# Patient Record
Sex: Male | Born: 1985 | Race: White | Hispanic: No | Marital: Single | Smoking: Never smoker
Health system: Southern US, Community
[De-identification: ages and names within clinical notes are randomized; demographics above are authoritative.]

## PROBLEM LIST (undated history)

## (undated) DIAGNOSIS — J302 Other seasonal allergic rhinitis: Secondary | ICD-10-CM

## (undated) DIAGNOSIS — K219 Gastro-esophageal reflux disease without esophagitis: Secondary | ICD-10-CM

---

## 2013-04-30 ENCOUNTER — Encounter (HOSPITAL_COMMUNITY): Payer: Self-pay

## 2013-04-30 ENCOUNTER — Emergency Department (HOSPITAL_COMMUNITY): Payer: BC Managed Care – PPO

## 2013-04-30 ENCOUNTER — Emergency Department (HOSPITAL_COMMUNITY)
Admission: EM | Admit: 2013-04-30 | Discharge: 2013-04-30 | Disposition: A | Discharge status: Home / Self Care | Payer: BC Managed Care – PPO | Attending: Emergency Medicine | Admitting: Emergency Medicine

## 2013-04-30 DIAGNOSIS — R209 Unspecified disturbances of skin sensation: Secondary | ICD-10-CM | POA: Insufficient documentation

## 2013-04-30 DIAGNOSIS — R202 Paresthesia of skin: Secondary | ICD-10-CM

## 2013-04-30 HISTORY — DX: Other seasonal allergic rhinitis: J30.2

## 2013-04-30 LAB — POCT I-STAT, CHEM 8
BUN: 11 mg/dL (ref 6–23)
Calcium, Ion: 1.19 mmol/L (ref 1.12–1.23)
Chloride: 105 mEq/L (ref 96–112)
Creatinine, Ser: 0.8 mg/dL (ref 0.50–1.35)
Glucose, Bld: 80 mg/dL (ref 70–99)

## 2013-04-30 NOTE — ED Provider Notes (Signed)
History     CSN: [id number]  Arrival date & time 04/30/13  1338   First MD Initiated Contact with Patient 04/30/13 1457      Chief Complaint  Patient presents with  . Numbness    (Consider location/radiation/quality/duration/timing/severity/associated sxs/prior treatment) HPI Complaint of numbness in right forearm and right thigh and right face gradual onset yesterday numbness has since spread to left for head. He has no trouble speaking no trouble seeing no trouble moving extremities or with coordination no weakness. No other complaint no pain anywhere. No other associated symptoms. No treatment prior to coming here. Denies "dizziness. Past Medical History  Diagnosis Date  . Seasonal allergies    Past medical history negative No past surgical history on file. Surgical history negative History reviewed. No pertinent family history.  History  Substance Use Topics  . Smoking status: Never Smoker   . Smokeless tobacco: Not on file  . Alcohol Use: No   Occasional alcohol no druguse   Review of Systems  Constitutional: Negative.   HENT: Negative.   Respiratory: Negative.   Cardiovascular: Negative.   Gastrointestinal: Negative.   Musculoskeletal: Negative.   Skin: Negative.   Neurological: Positive for numbness.  Psychiatric/Behavioral: Negative.   All other systems reviewed and are negative.    Allergies  Review of patient's allergies indicates no known allergies.  Home Medications   Current Outpatient Rx  Name  Route  Sig  Dispense  Refill  . guaiFENesin (ROBITUSSIN) 100 MG/5ML liquid   Oral   Take 200 mg by mouth 3 (three) times daily as needed for cough or congestion.         . Multiple Vitamin (MULTIVITAMIN WITH MINERALS) TABS   Oral   Take 1 tablet by mouth daily.         . Pseudoeph-Doxylamine-DM-APAP (NYQUIL PO)   Oral   Take 2 capsules by mouth every 4 (four) hours as needed (for cough).           BP 122/68  Pulse 96  Temp(Src) 97.7 F  (36.5 C) (Oral)  Resp 20  Ht 5\' 6"  (1.676 m)  Wt 244 lb (110.678 kg)  BMI 39.4 kg/m2  SpO2 99%  Physical Exam  Nursing note and vitals reviewed. Constitutional: He is oriented to person, place, and time. He appears well-developed and well-nourished.  HENT:  Head: Normocephalic and atraumatic.  Eyes: Conjunctivae are normal. Pupils are equal, round, and reactive to light.  Neck: Neck supple. No tracheal deviation present. No thyromegaly present.  Cardiovascular: Normal rate and regular rhythm.   No murmur heard. Pulmonary/Chest: Effort normal and breath sounds normal.  Abdominal: Soft. Bowel sounds are normal. He exhibits no distension. There is no tenderness.  Musculoskeletal: Normal range of motion. He exhibits no edema and no tenderness.  Neurological: He is alert and oriented to person, place, and time. He has normal reflexes. No cranial nerve deficit. Coordination normal.  Gait normal Romberg normal pronator drift normal  Skin: Skin is warm and dry. No rash noted.  Psychiatric: He has a normal mood and affect.    ED Course  Procedures (including critical care time)  Labs Reviewed - No data to display No results found.   No diagnosis found.  Results for orders placed during the hospital encounter of 04/30/13  POCT I-STAT, CHEM 8      Result Value Range   Sodium 141  135 - 145 mEq/L   Potassium 4.1  3.5 - 5.1 mEq/L   Chloride 105  96 - 112 mEq/L   BUN 11  6 - 23 mg/dL   Creatinine, Ser 1.61  0.50 - 1.35 mg/dL   Glucose, Bld 80  70 - 99 mg/dL   Calcium, Ion 0.96  0.45 - 1.23 mmol/L   TCO2 28  0 - 100 mmol/L   Hemoglobin 17.0  13.0 - 17.0 g/dL   HCT 40.9  81.1 - 91.4 %   Mr Brain Wo Contrast  04/30/2013   *RADIOLOGY REPORT*  Clinical Data: Right-sided numbness, which is improving.  MRI HEAD WITHOUT CONTRAST  Technique:  Multiplanar, multiecho pulse sequences of the brain and surrounding structures were obtained according to standard protocol without intravenous  contrast.  Comparison: None.  Findings: There is no evidence for acute infarction, intracranial hemorrhage, mass lesion, hydrocephalus, or extra-axial fluid. There is no cerebral or cerebellar atrophy.  Abnormal T2 and FLAIR subcentimeter hyperintensities in the right globus pallidus and the genu of the right internal capsule are not seen on the left. There are no similar lesions in and around the corpus callosum or periventricular white matter, although a similar right posterior frontal subcortical white matter lesion is also present.  No brainstem or cerebellar white matter lesions are seen. These lesions do not display restricted diffusion and do not appear acute but are abnormal findings at this age.  Considerations include vasculitis, demyelinating disease (MS), chronic infection, or idiopathic.  Flow voids are maintained in the major intracranial vessels.  There are no foci of chronic hemorrhage.  The pituitary and cerebellar tonsils are unremarkable.  No upper cervical region abnormalities are detected.  Normal corpus callosum.  Mild chronic sinus disease without air-fluid level.  Negative orbits.  No mastoid fluid.  A subcentimeter lateral retropharyngeal node is noted.  No other areas concerning for adenopathy are visualized.  IMPRESSION: No acute stroke, hemorrhage, or mass lesion.  Abnormal T2 hyperintensities in the right globus pallidus and internal capsule are not normally seen in this age.  Considerations include vasculitis, demyelinating lesion, chronic infection, or idiopathic.  No intracranial abnormality is detected which might result in right- sided numbness.   Original Report Authenticated By: Davonna Belling, M.D.     MDM  Patient and MRI scan discussed with Dr.Kirkpatrick, neurologist. He reviewed the MRI scan images. Plan no further treatment at present he feels that the patient should be followed with [facility] as an outpatient Return if symptoms worsen Diagnosis  paresthesias        Doug Sou, MD 04/30/13 1728

## 2013-04-30 NOTE — ED Notes (Signed)
He states he felt some right-sided "numbness", including right arm and leg, also right face area yesterday at ~11:30 p.m.  He c/o this "nimbness" persisting, and he is concerned because today he is a bit dizzy and also he tells me the left side of his face is beginning to feel numb.  He states he is just recovering, and is almost over, a "bad cold".  He is alert and oriented x 4 with clear speech.

## 2013-04-30 NOTE — ED Notes (Signed)
Patient transported to MRI 

## 2013-04-30 NOTE — ED Notes (Addendum)
Patient has no sensory deprivation in upper or lower extremities. Pupils are equal and reactive. No complaints of dizziness when going from sitting to standing position. Strong bilateral hand grips and strong push/pull with lower extremities. Stated that he felt he numbness when he went to sleep last night and woke up this morning and still felt numbness on his right side. Continuing to feel constant numbness. States he is not sure of whether it is from a recent cold he is getting over. No drift noted in upper extremities with eyes closed and no deviation in tongue.

## 2013-04-30 NOTE — ED Notes (Signed)
[id number]<AV> Expected date:<BR> Expected time:<BR> Means of arrival:<BR> Comments:<BR> Hold for traige

## 2013-05-03 ENCOUNTER — Encounter: Payer: Self-pay | Admitting: Neurology

## 2013-05-05 ENCOUNTER — Encounter: Payer: Self-pay | Admitting: Neurology

## 2013-05-05 ENCOUNTER — Ambulatory Visit (INDEPENDENT_AMBULATORY_CARE_PROVIDER_SITE_OTHER): Payer: BC Managed Care – PPO | Admitting: Neurology

## 2013-05-05 VITALS — BP 110/72 | HR 71 | Temp 97.6°F | Ht 66.0 in | Wt 238.0 lb

## 2013-05-05 DIAGNOSIS — R079 Chest pain, unspecified: Secondary | ICD-10-CM | POA: Insufficient documentation

## 2013-05-05 DIAGNOSIS — R200 Anesthesia of skin: Secondary | ICD-10-CM | POA: Insufficient documentation

## 2013-05-05 DIAGNOSIS — R209 Unspecified disturbances of skin sensation: Secondary | ICD-10-CM

## 2013-05-05 NOTE — Progress Notes (Signed)
History of present illness: Jimmy Gutierrez is a 27 years old right-handed Caucasian male, referred by emergency room for evaluation of numbness of his right arm   He was previously healthy, in May 17th 11:30pm at night, he was doing mini-activity at room, noticed right arm, face, leg  numbness, no tingling, he can still feel it. Symptoms were persistent, he went to emergency room May 18th, MRI brain showed no acute lesions, abnormal T2 hyperintensities in the right globus pallidus and internal capsule.  He denies weakness, recent few days, he also noticed intermittent chest pain, laboratory showed a normal BMP, hemoglobin  Review of Systems  Out of a complete 14 system review, the patient complains of only the following symptoms, and all other reviewed systems are negative.   Constitutional:   N/A Cardiovascular:  Chest pain Ear/Nose/Throat:  N/A Skin: N/A Eyes: N/A Respiratory:  cough Gastroitestinal: N/A    Hematology/Lymphatic:  N/A Endocrine:  N/A Musculoskeletal: aching muscles Allergy/Immunology: N/A Neurological: numbness Psychiatric:    N/A  PHYSICAL EXAMINATOINS:  Generalized: In no acute distress  Neck: Supple, no carotid bruits   Cardiac: Regular rate rhythm  Pulmonary: Clear to auscultation bilaterally  Musculoskeletal: No deformity  Neurological examination  Mentation: Alert oriented to time, place, history taking, and causual conversation  Cranial nerve II-XII: Pupils were equal round reactive to light extraocular movements were full, visual field were full on confrontational test. facial sensation and strength were normal. hearing was intact to finger rubbing bilaterally. Uvula tongue midline.  head turning and shoulder shrug and were normal and symmetric.Tongue protrusion into cheek strength was normal.  Motor: normal tone, bulk and strength.  Sensory: Intact to fine touch, pinprick, preserved vibratory sensation, and proprioception at toes.  Coordination:  Normal finger to nose, heel-to-shin bilaterally there was no truncal ataxia  Gait: Rising up from seated position without assistance, normal stance, without trunk ataxia, moderate stride, good arm swing, smooth turning, able to perform tiptoe, and heel walking without difficulty.   Romberg signs: Negative  Deep tendon reflexes: Brachioradialis 2/2, biceps 2/2, triceps 2/2, patellar 2/2, Achilles 2/2, plantar responses were flexor bilaterally.   A/p:  27 years old Caucasian male, with few day's history of intermittent numbness, normal neurological examination. Essentially normal MRI of the brain, laboratory evaluation  1. Continue to observe his symptoms, 2 he also complains of chest pain, will check troponin and TSH.  3. unsure etiology, less likely neurological return to clinic as needed

## 2013-05-23 ENCOUNTER — Emergency Department (HOSPITAL_COMMUNITY)
Admission: EM | Admit: 2013-05-23 | Discharge: 2013-05-23 | Disposition: A | Discharge status: Home / Self Care | Payer: BC Managed Care – PPO | Attending: Emergency Medicine | Admitting: Emergency Medicine

## 2013-05-23 ENCOUNTER — Encounter (HOSPITAL_COMMUNITY): Payer: Self-pay | Admitting: Emergency Medicine

## 2013-05-23 DIAGNOSIS — R509 Fever, unspecified: Secondary | ICD-10-CM | POA: Insufficient documentation

## 2013-05-23 DIAGNOSIS — R209 Unspecified disturbances of skin sensation: Secondary | ICD-10-CM | POA: Insufficient documentation

## 2013-05-23 DIAGNOSIS — R200 Anesthesia of skin: Secondary | ICD-10-CM

## 2013-05-23 DIAGNOSIS — R079 Chest pain, unspecified: Secondary | ICD-10-CM | POA: Insufficient documentation

## 2013-05-23 NOTE — ED Provider Notes (Signed)
History     CSN: [id number]  Arrival date & time 05/23/13  1530   First MD Initiated Contact with Patient 05/23/13 1613      No chief complaint on file.   (Consider location/radiation/quality/duration/timing/severity/associated sxs/prior treatment) HPI  Pt is an otherwise healthy 27 yo M presenting to the ED for sustained left sided numbness w/ intermittent right sided and facial numbness since he was evaluated in the ED on Apr 30, 2013. Pt states he followed up with neurology where they told him that it was unlikely a neurologic reason for his numbness. Pt has had one to two episodes of CP associated with sneezing. Patient was seen yesterday at [facility] health services for additional lab testing for possible diagnosis of numbness. His results are still pending. Denies bladder or bowel incontinence.    Past Medical History  Diagnosis Date  . Seasonal allergies     History reviewed. No pertinent past surgical history.  Family History  Problem Relation Age of Onset  . Diabetes type II Mother   . High blood pressure Mother   . Cancer Father     History  Substance Use Topics  . Smoking status: Never Smoker   . Smokeless tobacco: Not on file  . Alcohol Use: No     Comment: Rare special events.      Review of Systems  Constitutional: Positive for fever and chills.       Subjective fever  HENT: Negative for neck pain.   Eyes: Negative for visual disturbance.  Respiratory: Negative for cough.   Cardiovascular: Positive for chest pain.  Gastrointestinal: Negative for nausea, vomiting and abdominal pain.  Genitourinary: Negative.   Musculoskeletal: Negative for back pain.  Skin: Negative.   Neurological: Positive for numbness. Negative for dizziness, syncope, facial asymmetry, speech difficulty, weakness, light-headedness and headaches.    Allergies  Mobic  Home Medications  No current outpatient prescriptions on file.  BP 131/66  Pulse 75  Temp(Src) 97.5 F (36.4 C)  (Oral)  Resp 20  Wt 233 lb (105.688 kg)  BMI 37.63 kg/m2  SpO2 97%  Physical Exam  Constitutional: He is oriented to person, place, and time. He appears well-developed and well-nourished.  HENT:  Head: Normocephalic and atraumatic.  Eyes: EOM are normal. Pupils are equal, round, and reactive to light.  Cardiovascular: Normal rate, regular rhythm, normal heart sounds, intact distal pulses and normal pulses.   Pulmonary/Chest: Effort normal and breath sounds normal. No respiratory distress.  Abdominal: Soft. Bowel sounds are normal.  Neurological: He is alert and oriented to person, place, and time. He has normal strength. No cranial nerve deficit.  Finger-nose-finger and heel-knee-shin intact bilaterally. No pronator drift. Subjective sensory deficit    Skin: Skin is warm and dry.  Psychiatric: He has a normal mood and affect.    ED Course  Procedures (including critical care time)   Date: 05/23/2013  Rate: 71  Rhythm: normal sinus rhythm  QRS Axis: normal  Intervals: normal  ST/T Wave abnormalities: normal  Conduction Disutrbances:none  Narrative Interpretation:   Old EKG Reviewed: none available    Labs Reviewed - No data to display No results found.   1. Numbness       MDM  Pt is a 27 yo M who has been previously evaluated in the ED and by [facility] for his generalized body numbness and weakness presenting with same symptoms at today's visit. PE without change from previous evaluation. VSS. EKG reviewed. No need for re-MRI at  today's visit. Pt advised to follow up with PCP lab draws for further investigation of numbness as well as with [facility] neurology. Patient is agreeable to plan. Patient d/w with Dr. Bebe Shaggy, agrees with plan. Patient is stable at time of discharge          Jeannetta Ellis, PA-C 05/25/13 1610

## 2013-05-23 NOTE — Progress Notes (Signed)
   CARE MANAGEMENT ED NOTE 05/23/2013  Patient:  Jimmy Gutierrez, Jimmy Gutierrez   Account Number:  [id number]  Date Initiated:  05/23/2013  Documentation initiated by:  Radford Pax  Subjective/Objective Assessment:     Subjective/Objective Assessment Detail:     Action/Plan:   Action/Plan Detail:   Anticipated DC Date:       Status Recommendation to Physician:   Result of Recommendation:    Other ED Services  Consult Working Plan    DC Planning Services  Other  PCP issues    Choice offered to / List presented to:            Status of service:  Completed, signed off  ED Comments:   ED Comments Detail:  Patient listed as not having a pcp.  EDCM went to speak to patient who stated his pcp is Jimmy Gutierrez at [facility].  Also instructed patient to call number on back of insurance card to find pcp within network.  Patient has appointment with Jimmy Gutierrez tomorrow.  Patient thankful for services. Offered patient support.  No further needs at this time.

## 2013-05-23 NOTE — ED Notes (Signed)
Patient with weakness and numbness to most areas of body especially in left arm, face, and left leg.  Patient was here on May 18th for same symptoms and sent to a neurologist the following.  He had an MRI here and this was reviewed by neurologist.  Patient went to MD yesterday [facility] who put him on Meloxicam.  This caused burning in his stomach and left arm with increased numbness in left arm.  Patient has also been having chest pain when lying down but is not currently having it, along with some SOB.  Patient reports chest pain was more like a muscle ache.

## 2013-05-25 NOTE — ED Provider Notes (Signed)
Medical screening examination/treatment/procedure(s) were conducted as a shared visit with non-physician practitioner(s) and myself.  I personally evaluated the patient during the encounter  Pt well appearing, no distress, no focal weakness noted on my evaluation He denies any recent poisonings CO exposure, no tick bites and no other significant recent illnesses he is stable for further outpatient management   Joya Gaskins, MD 05/25/13 1023

## 2016-08-18 ENCOUNTER — Encounter (HOSPITAL_COMMUNITY): Payer: Self-pay | Admitting: Emergency Medicine

## 2016-08-18 ENCOUNTER — Emergency Department (HOSPITAL_COMMUNITY)
Admission: EM | Admit: 2016-08-18 | Discharge: 2016-08-18 | Disposition: A | Discharge status: Home / Self Care | Payer: BLUE CROSS/BLUE SHIELD | Attending: Emergency Medicine | Admitting: Emergency Medicine

## 2016-08-18 DIAGNOSIS — H9201 Otalgia, right ear: Secondary | ICD-10-CM | POA: Diagnosis not present

## 2016-08-18 DIAGNOSIS — R0982 Postnasal drip: Secondary | ICD-10-CM | POA: Diagnosis not present

## 2016-08-18 DIAGNOSIS — K2100 Gastro-esophageal reflux disease with esophagitis, without bleeding: Secondary | ICD-10-CM

## 2016-08-18 DIAGNOSIS — R079 Chest pain, unspecified: Secondary | ICD-10-CM | POA: Diagnosis present

## 2016-08-18 MED ORDER — GI COCKTAIL ~~LOC~~
30.0000 mL | Freq: Once | ORAL | Status: AC
  Administered 2016-08-18: 30 mL via ORAL
  Filled 2016-08-18: qty 30

## 2016-08-18 MED ORDER — CARBAMIDE PEROXIDE 6.5 % OT SOLN: 5.0000 [drp] | Freq: Two times a day (BID) | OTIC | 0 refills | Status: DC

## 2016-08-18 MED ORDER — PANTOPRAZOLE SODIUM 20 MG PO TBEC: 20.0000 mg | DELAYED_RELEASE_TABLET | Freq: Every day | ORAL | 0 refills | Status: DC

## 2016-08-18 NOTE — ED Provider Notes (Signed)
[facility]-EMERGENCY DEPT Provider Note   CSN: [id number] Arrival date & time: 08/18/16  1805  By signing my name below, I, Jimmy Gutierrez, attest that this documentation has been prepared under the direction and in the presence of Fhn Memorial HospitalEmily Rylan Kaufmann, PA-C.  Electronically Signed: Rosario AdieWilliam Andrew Gutierrez, ED Scribe. 08/18/16. 8:44 PM.  History   Chief Complaint Chief Complaint  Patient presents with  . Heartburn  . Otalgia   The history is provided by the patient. No language interpreter was used.   HPI Comments: Jimmy LeveringJeffrey Joseph Gutierrez is a 30 y.o. male with a PMHx of seasonal allergies, who presents to the Emergency Department complaining of intermittent episodes of burning and sharp central and upper chest pain x ~1 week. Pt states that this pain radiates up and down the midline of his chest. He reports associated yellow and white tongue discoloration x ~1 month. He additionally notes associated intermittent episodes of mild, dry cough and post-nasal drip. Pt has a hx of GERD. He states that his chest pain is similar to his hx of GERD, and is associated with eating any types of food. Pt states that his pain in the past was usually brought on with certain types of food, but is now brought on with any types of food. He notes that during these episodes of burning pain, he also had right-sided, burning ear pain and that his hearing is mildly decreased on that side. Pt usually takes antacid tablets for this problem, but notes that they are now minimally controlling his symptoms. Pt is a nonsmoker, and only occasionally drinks alcohol. He has not seen a PCP for this problem. Pt notes that his father died of esophageal cancer in his 6550s, which has made him anxious about his symptoms that he presents with today. He denies having problems with dental caries in the past. Denies fever, chills, SOB, vomiting, dysuria, frequency, urgency, hematuria, polydipsia, nasal congestion, or any other associated symptoms.   Past  Medical History:  Diagnosis Date  . Seasonal allergies    Patient Active Problem List   Diagnosis Date Noted  . Chest pain 05/05/2013  . Numbness 05/05/2013   History reviewed. No pertinent surgical history.  Home Medications    Prior to Admission medications   Medication Sig Start Date End Date Taking? Authorizing Provider  carbamide peroxide (DEBROX) 6.5 % otic solution Place 5 drops into the right ear 2 (two) times daily. 08/18/16   Trixie DredgeEmily Halton Neas, PA-C  pantoprazole (PROTONIX) 20 MG tablet Take 1 tablet (20 mg total) by mouth daily. 08/18/16   Trixie DredgeEmily Lilianna Case, PA-C   Family History Family History  Problem Relation Age of Onset  . Diabetes type II Mother   . High blood pressure Mother   . Cancer Father    Social History Social History  Substance Use Topics  . Smoking status: Never Smoker  . Smokeless tobacco: Never Used  . Alcohol use No     Comment: Rare special events.   Allergies   Mobic [meloxicam]  Review of Systems Review of Systems  Constitutional: Negative for chills and fever.  HENT: Positive for ear pain, hearing loss and postnasal drip.   Respiratory: Positive for cough. Negative for shortness of breath.   Cardiovascular: Positive for chest pain.  Gastrointestinal: Positive for heartburn. Negative for vomiting.  Endocrine: Negative for polydipsia.  Genitourinary: Negative for dysuria, frequency, hematuria and urgency.  All other systems reviewed and are negative.  Physical Exam Updated Vital Signs BP 138/77   Pulse 85  Temp 98.2 F (36.8 C) (Oral)   Resp 18   Ht 5\' 6"  (1.676 m)   Wt 97.5 kg   SpO2 99%   BMI 34.70 kg/m   Physical Exam  Constitutional: He appears well-developed and well-nourished. No distress.  HENT:  Head: Normocephalic and atraumatic.  Mouth/Throat: Oropharynx is clear and moist. No oropharyngeal exudate.  Thick, soft white coating of tongue that scrapes off with tongue depressor. Large amount of dark cerumen in the right ear canal.  Appears to have a clear effusion in the right.   Eyes: Conjunctivae and EOM are normal. Right eye exhibits no discharge. Left eye exhibits no discharge.  Neck: Normal range of motion. Neck supple.  Cardiovascular: Normal rate and regular rhythm.   Pulmonary/Chest: Effort normal and breath sounds normal. No stridor. No respiratory distress. He has no wheezes. He has no rales.  Abdominal: Soft. Bowel sounds are normal. He exhibits no distension and no mass. There is no tenderness. There is no rebound and no guarding.  Lymphadenopathy:    He has no cervical adenopathy.  Neurological: He is alert. He exhibits normal muscle tone.  Skin: He is not diaphoretic.  Nursing note and vitals reviewed.  ED Treatments / Results  DIAGNOSTIC STUDIES: Oxygen Saturation is 96% on RA, normal by my interpretation.   COORDINATION OF CARE: 8:41 PM-Discussed next steps with pt. Pt verbalized understanding and is agreeable with the plan.   Labs (all labs ordered are listed, but only abnormal results are displayed) Labs Reviewed - No data to display  EKG  EKG Interpretation None      Radiology No results found.  Procedures Procedures (including critical care time)  Medications Ordered in ED Medications  gi cocktail (Maalox,Lidocaine,Donnatal) (30 mLs Oral Given 08/18/16 2046)    Initial Impression / Assessment and Plan / ED Course  I have reviewed the triage vital signs and the nursing notes.  Pertinent labs & imaging results that were available during my care of the patient were reviewed by me and considered in my medical decision making (see chart for details).  Clinical Course    Afebrile nontoxic patient with multiple complaints, anxiety surrounding what clinically seems to be GERD.  Has heartburn following eating, sour taste in mouth, bad breath, tongue coating.  Does have right ear pain that may be related to large amount of cerumen vs eustachian dysfunction/serous otitis media. Pt  encouraged to follow up with primary care, also given GI for worsening symptoms with prescribed protonix.  Discussed result, findings, treatment, and follow up  with patient.  Pt given return precautions.  Pt verbalizes understanding and agrees with plan.      Final Clinical Impressions(s) / ED Diagnoses   Final diagnoses:  Gastroesophageal reflux disease with esophagitis    New Prescriptions Discharge Medication List as of 08/18/2016  8:56 PM    START taking these medications   Details  carbamide peroxide (DEBROX) 6.5 % otic solution Place 5 drops into the right ear 2 (two) times daily., Starting Tue 08/18/2016, Print    pantoprazole (PROTONIX) 20 MG tablet Take 1 tablet (20 mg total) by mouth daily., Starting Tue 08/18/2016, Print        I personally performed the services described in this documentation, which was scribed in my presence. The recorded information has been reviewed and is accurate.    Trixie Dredge, PA-C 08/19/16 0201    Benjiman Core, MD 08/19/16 [phone]

## 2016-08-18 NOTE — ED Notes (Signed)
Pt requested to talk to PA for another problem.  PA notified

## 2016-08-18 NOTE — ED Notes (Signed)
Patient was alert, oriented and stable upon discharge. RN went over AVS and patient had no further questions.  

## 2016-08-18 NOTE — Discharge Instructions (Signed)
Read the information below.  Use the prescribed medication as directed.  Please discuss all new medications with your pharmacist.  You may return to the Emergency Department at any time for worsening condition or any new symptoms that concern you.   If you develop worsening chest pain, shortness of breath, fever, you pass out, or become weak or dizzy, return to the ER for a recheck.    °

## 2016-08-18 NOTE — ED Notes (Signed)
PA at bedside.

## 2016-08-18 NOTE — ED Triage Notes (Signed)
Patient reports heartburn x1 1/2 weeks; change in tongue color and nasal drainage x1 month; and right ear ache x5 days.

## 2016-08-18 NOTE — ED Notes (Signed)
Pt states his father had esophageal cancer and this doesn't feel like normal heart burn to him and he wants to be checked just to make sure it is nothing more than that.

## 2017-03-08 ENCOUNTER — Emergency Department (HOSPITAL_COMMUNITY)
Admission: EM | Admit: 2017-03-08 | Discharge: 2017-03-08 | Disposition: A | Discharge status: Home / Self Care | Payer: BLUE CROSS/BLUE SHIELD | Attending: Emergency Medicine | Admitting: Emergency Medicine

## 2017-03-08 ENCOUNTER — Encounter (HOSPITAL_COMMUNITY): Payer: Self-pay | Admitting: Oncology

## 2017-03-08 DIAGNOSIS — A084 Viral intestinal infection, unspecified: Secondary | ICD-10-CM | POA: Insufficient documentation

## 2017-03-08 DIAGNOSIS — Z79899 Other long term (current) drug therapy: Secondary | ICD-10-CM | POA: Insufficient documentation

## 2017-03-08 DIAGNOSIS — R1110 Vomiting, unspecified: Secondary | ICD-10-CM | POA: Diagnosis present

## 2017-03-08 HISTORY — DX: Gastro-esophageal reflux disease without esophagitis: K21.9

## 2017-03-08 LAB — CBC
HEMATOCRIT: 49.3 % (ref 39.0–52.0)
HEMOGLOBIN: 17.8 g/dL — AB (ref 13.0–17.0)
MCH: 30.9 pg (ref 26.0–34.0)
MCHC: 36.1 g/dL — AB (ref 30.0–36.0)
MCV: 85.6 fL (ref 78.0–100.0)
Platelets: 269 10*3/uL (ref 150–400)
RBC: 5.76 MIL/uL (ref 4.22–5.81)
RDW: 13.1 % (ref 11.5–15.5)
WBC: 16.4 10*3/uL — AB (ref 4.0–10.5)

## 2017-03-08 LAB — COMPREHENSIVE METABOLIC PANEL
ALT: 74 U/L — AB (ref 17–63)
AST: 53 U/L — AB (ref 15–41)
Albumin: 4.8 g/dL (ref 3.5–5.0)
Alkaline Phosphatase: 42 U/L (ref 38–126)
Anion gap: 9 (ref 5–15)
BUN: 22 mg/dL — ABNORMAL HIGH (ref 6–20)
CHLORIDE: 109 mmol/L (ref 101–111)
CO2: 21 mmol/L — AB (ref 22–32)
Calcium: 9.1 mg/dL (ref 8.9–10.3)
Creatinine, Ser: 1.03 mg/dL (ref 0.61–1.24)
GFR calc non Af Amer: >60 mL/min (ref >60)
Glucose, Bld: 128 mg/dL — ABNORMAL HIGH (ref 65–99)
POTASSIUM: 4.2 mmol/L (ref 3.5–5.1)
SODIUM: 139 mmol/L (ref 135–145)
Total Bilirubin: 0.7 mg/dL (ref 0.3–1.2)
Total Protein: 7.8 g/dL (ref 6.5–8.1)

## 2017-03-08 LAB — URINALYSIS, ROUTINE W REFLEX MICROSCOPIC
GLUCOSE, UA: NEGATIVE mg/dL
HGB URINE DIPSTICK: NEGATIVE
Ketones, ur: 5 mg/dL — AB
LEUKOCYTES UA: NEGATIVE
NITRITE: NEGATIVE
PROTEIN: 100 mg/dL — AB
Specific Gravity, Urine: 1.032 — ABNORMAL HIGH (ref 1.005–1.030)
pH: 5 (ref 5.0–8.0)

## 2017-03-08 LAB — LIPASE, BLOOD: LIPASE: 23 U/L (ref 11–51)

## 2017-03-08 MED ORDER — ONDANSETRON HCL 4 MG/2ML IJ SOLN
4.0000 mg | Freq: Once | INTRAMUSCULAR | Status: AC
  Administered 2017-03-08: 4 mg via INTRAVENOUS
  Filled 2017-03-08: qty 2

## 2017-03-08 MED ORDER — DICYCLOMINE HCL 20 MG PO TABS: 20.0000 mg | ORAL_TABLET | Freq: Four times a day (QID) | ORAL | 0 refills | Status: DC | PRN

## 2017-03-08 MED ORDER — SODIUM CHLORIDE 0.9 % IV BOLUS (SEPSIS)
1000.0000 mL | Freq: Once | INTRAVENOUS | Status: AC
  Administered 2017-03-08: 1000 mL via INTRAVENOUS

## 2017-03-08 MED ORDER — ONDANSETRON 8 MG PO TBDP
8.0000 mg | ORAL_TABLET | Freq: Three times a day (TID) | ORAL | 0 refills | Status: DC | PRN
Start: 2017-03-08 — End: 2017-09-24

## 2017-03-08 MED ORDER — PANTOPRAZOLE SODIUM 40 MG IV SOLR
40.0000 mg | Freq: Once | INTRAVENOUS | Status: AC
  Administered 2017-03-08: 40 mg via INTRAVENOUS
  Filled 2017-03-08: qty 40

## 2017-03-08 MED ORDER — LOPERAMIDE HCL 2 MG PO CAPS
4.0000 mg | ORAL_CAPSULE | Freq: Once | ORAL | Status: AC
  Administered 2017-03-08: 4 mg via ORAL
  Filled 2017-03-08: qty 2

## 2017-03-08 MED ORDER — DICYCLOMINE HCL 10 MG PO CAPS
20.0000 mg | ORAL_CAPSULE | Freq: Once | ORAL | Status: AC
  Administered 2017-03-08: 20 mg via ORAL
  Filled 2017-03-08: qty 2

## 2017-03-08 MED ORDER — LACTATED RINGERS IV BOLUS (SEPSIS)
1000.0000 mL | Freq: Once | INTRAVENOUS | Status: AC
  Administered 2017-03-08: 1000 mL via INTRAVENOUS

## 2017-03-08 NOTE — ED Notes (Signed)
Pt had bottle water did well

## 2017-03-08 NOTE — ED Triage Notes (Signed)
Pt states that at approximately 2300 last night he started to feel dizzy and lightheaded.  Pt reports immediately after that he began vomiting and having diarrhea.  Pt reports eating spicy fatty food yesterday and does have a hx of GERD.  Pt vomited 100 ml of green bile in triage. Rates pain 8/10.

## 2017-03-08 NOTE — ED Notes (Signed)
Requested urine from patient. 

## 2017-03-08 NOTE — ED Provider Notes (Addendum)
[facility]-EMER[facility]ENCY DEPT Provider Note: Jimmy DellJ. Lane [name], MD, FACEP  CSN: [id number] MRN: [id number] ARRIVAL: 03/08/17 at 0259 ROOM: WA18/WA18  By signing my name below, I, Jimmy Gutierrez, attest that this documentation has been prepared under the direction and in the presence of J. Brock BadLane Nakota Ackert, MD, FACEP. Electronically signed, Jimmy Gutierrez, ED Scribe. 03/08/17. 3:34 AM.  CHIEF COMPLAINT  Vomiting   HISTORY OF PRESENT ILLNESS  HPI Comments: Jimmy Gutierrez is a 31 y.o. male with Hx of acid reflux and FMHx of esophageal cancer who presents to the Emergency Department complaining of lightheadedness/queasiness onset around 2300 last night. Pt notes associated vomiting x 10-15 and watery, non-bloody diarrhea starting soon after. He reportedly vomited ~100 mL of green bile in triage. He currently c/o sore throat d/t vomiting, dry mouth and episodic, 8/10 epigastric abdominal cramping but none currently.    Past Medical History:  Diagnosis Date  . GERD (gastroesophageal reflux disease)   . Seasonal allergies     History reviewed. No pertinent surgical history.  Family History  Problem Relation Age of Onset  . Diabetes type II Mother   . High blood pressure Mother   . Cancer Father     Social History  Substance Use Topics  . Smoking status: Never Smoker  . Smokeless tobacco: Never Used  . Alcohol use No     Comment: Rare special events.    Prior to Admission medications   Medication Sig Start Date End Date Taking? Authorizing Provider  calcium carbonate (TUMS - DOSED IN MG ELEMENTAL CALCIUM) 500 MG chewable tablet Chew 1 tablet by mouth daily as needed for indigestion or heartburn.   Yes Historical Provider, MD  ranitidine (ZANTAC) 75 MG tablet Take 75 mg by mouth daily as needed for heartburn.   Yes Historical Provider, MD  carbamide peroxide (DEBROX) 6.5 % otic solution Place 5 drops into the right ear 2 (two) times daily. Patient not taking: Reported on 03/08/2017 08/18/16    Trixie DredgeEmily West, PA-C  pantoprazole (PROTONIX) 20 MG tablet Take 1 tablet (20 mg total) by mouth daily. Patient not taking: Reported on 03/08/2017 08/18/16   Trixie DredgeEmily West, PA-C    Allergies Mobic [meloxicam]   REVIEW OF SYSTEMS  Negative except as noted here or in the History of Present Illness.   PHYSICAL EXAMINATION  Initial Vital Signs Blood pressure (!) 142/98, pulse (!) 109, temperature 97.7 F (36.5 C), temperature source Oral, resp. rate (!) 24, SpO2 99 %.  Examination General: Well-developed, well-nourished male in no acute distress; appearance consistent with age of record HENT: normocephalic; atraumatic; oral mucosa dry Eyes: pupils equal, round and reactive to light; extraocular muscles intact Neck: supple Heart: regular rate and rhythm; tachycardia Lungs: clear to auscultation bilaterally Abdomen: soft; nondistended; nontender; no masses or hepatosplenomegaly; bowel sounds decreased Extremities: No deformity; full range of motion; pulses normal Neurologic: Awake, alert and oriented; motor function intact in all extremities and symmetric; no facial droop Skin: Warm and dry Psychiatric: Normal mood and affect   RESULTS  Summary of this visit's results, reviewed by myself:   EKG Interpretation  Date/Time:    Ventricular Rate:    PR Interval:    QRS Duration:   QT Interval:    QTC Calculation:   R Axis:     Text Interpretation:        Laboratory Studies: Results for orders placed or performed during the hospital encounter of 03/08/17 (from the past 24 hour(s))  Lipase, blood     Status:  None   Collection Time: 03/08/17  3:48 AM  Result Value Ref Range   Lipase 23 11 - 51 U/L  Comprehensive metabolic panel     Status: Abnormal   Collection Time: 03/08/17  3:48 AM  Result Value Ref Range   Sodium 139 135 - 145 mmol/L   Potassium 4.2 3.5 - 5.1 mmol/L   Chloride 109 101 - 111 mmol/L   CO2 21 (L) 22 - 32 mmol/L   Glucose, Bld 128 (H) 65 - 99 mg/dL   BUN 22 (H) 6  - 20 mg/dL   Creatinine, Ser 1.61 0.61 - 1.24 mg/dL   Calcium 9.1 8.9 - 09.6 mg/dL   Total Protein 7.8 6.5 - 8.1 g/dL   Albumin 4.8 3.5 - 5.0 g/dL   AST 53 (H) 15 - 41 U/L   ALT 74 (H) 17 - 63 U/L   Alkaline Phosphatase 42 38 - 126 U/L   Total Bilirubin 0.7 0.3 - 1.2 mg/dL   GFR calc non Af Amer >60 >60 mL/min   GFR calc Af Amer >60 >60 mL/min   Anion gap 9 5 - 15  CBC     Status: Abnormal   Collection Time: 03/08/17  3:48 AM  Result Value Ref Range   WBC 16.4 (H) 4.0 - 10.5 K/uL   RBC 5.76 4.22 - 5.81 MIL/uL   Hemoglobin 17.8 (H) 13.0 - 17.0 g/dL   HCT 04.5 40.9 - 81.1 %   MCV 85.6 78.0 - 100.0 fL   MCH 30.9 26.0 - 34.0 pg   MCHC 36.1 (H) 30.0 - 36.0 g/dL   RDW 91.4 78.2 - 95.6 %   Platelets 269 150 - 400 K/uL  Urinalysis, Routine w reflex microscopic     Status: Abnormal   Collection Time: 03/08/17  3:49 AM  Result Value Ref Range   Color, Urine AMBER (A) YELLOW   APPearance CLOUDY (A) CLEAR   Specific Gravity, Urine 1.032 (H) 1.005 - 1.030   pH 5.0 5.0 - 8.0   Glucose, UA NEGATIVE NEGATIVE mg/dL   Hgb urine dipstick NEGATIVE NEGATIVE   Bilirubin Urine SMALL (A) NEGATIVE   Ketones, ur 5 (A) NEGATIVE mg/dL   Protein, ur 213 (A) NEGATIVE mg/dL   Nitrite NEGATIVE NEGATIVE   Leukocytes, UA NEGATIVE NEGATIVE   RBC / HPF 0-5 0 - 5 RBC/hpf   WBC, UA 0-5 0 - 5 WBC/hpf   Bacteria, UA RARE (A) NONE SEEN   Squamous Epithelial / LPF 0-5 (A) NONE SEEN   Mucous PRESENT    Imaging Studies: No results found.  ED COURSE  Nursing notes and initial vitals signs, including pulse oximetry, reviewed.  Vitals:   03/08/17 0318 03/08/17 0401 03/08/17 0520 03/08/17 0736  BP:   118/69 105/60  Pulse:   89 93  Resp:   18 15  Temp: 97.7 F (36.5 C)  98.6 F (37 C)   TempSrc: Oral  Oral   SpO2:   93% 94%  Weight:  230 lb (104.3 kg)    Height:  5\' 6"  (1.676 m)     7:21 AM Patient still getting lightheaded and tachycardic when attempting to stand despite 3 liters of normal saline.  We'll administer a liter of lactated Ringer's. Abdominal cramping is improved after Bentyl tablets.  8:10 AM Patient has been able to tolerate oral liquids without emesis. He has been able to ambulate. After his fourth liter of fluid he is able to stand up without being lightheaded or becoming  tachycardic. Patient advised to take OTC Imodium for diarrhea per package instructions. Patient also advised to take over-the-counter Prilosec or Prevacid for the next week as I suspect his sore throat is due to acid burns from vomiting.  PROCEDURES    ED DIAGNOSES     ICD-9-CM ICD-10-CM   1. Viral gastroenteritis 008.8 A08.4      I personally performed the services described in this documentation, which was scribed in my presence. The recorded information has been reviewed and is accurate.    Paula Libra, MD 03/08/17 1610    Paula Libra, MD 03/08/17 [phone]

## 2017-03-09 LAB — GASTROINTESTINAL PANEL BY PCR, STOOL (REPLACES STOOL CULTURE)

## 2017-06-19 ENCOUNTER — Emergency Department (HOSPITAL_COMMUNITY): Payer: BLUE CROSS/BLUE SHIELD

## 2017-06-19 ENCOUNTER — Encounter (HOSPITAL_COMMUNITY): Payer: Self-pay | Admitting: Nurse Practitioner

## 2017-06-19 ENCOUNTER — Emergency Department (HOSPITAL_COMMUNITY)
Admission: EM | Admit: 2017-06-19 | Discharge: 2017-06-19 | Disposition: A | Discharge status: Home / Self Care | Payer: BLUE CROSS/BLUE SHIELD | Attending: Emergency Medicine | Admitting: Emergency Medicine

## 2017-06-19 DIAGNOSIS — R1030 Lower abdominal pain, unspecified: Secondary | ICD-10-CM | POA: Diagnosis not present

## 2017-06-19 DIAGNOSIS — Z79899 Other long term (current) drug therapy: Secondary | ICD-10-CM | POA: Insufficient documentation

## 2017-06-19 DIAGNOSIS — R1031 Right lower quadrant pain: Secondary | ICD-10-CM

## 2017-06-19 LAB — I-STAT CHEM 8, ED
BUN: 15 mg/dL (ref 6–20)
CHLORIDE: 103 mmol/L (ref 101–111)
CREATININE: 0.9 mg/dL (ref 0.61–1.24)
Calcium, Ion: 1.15 mmol/L (ref 1.15–1.40)
GLUCOSE: 81 mg/dL (ref 65–99)
HCT: 43 % (ref 39.0–52.0)
HEMOGLOBIN: 14.6 g/dL (ref 13.0–17.0)
POTASSIUM: 3.5 mmol/L (ref 3.5–5.1)
Sodium: 140 mmol/L (ref 135–145)
TCO2: 26 mmol/L (ref 0–100)

## 2017-06-19 LAB — CBC
HEMATOCRIT: 43.4 % (ref 39.0–52.0)
Hemoglobin: 15.7 g/dL (ref 13.0–17.0)
MCH: 30.4 pg (ref 26.0–34.0)
MCHC: 36.2 g/dL — AB (ref 30.0–36.0)
MCV: 83.9 fL (ref 78.0–100.0)
Platelets: 252 10*3/uL (ref 150–400)
RBC: 5.17 MIL/uL (ref 4.22–5.81)
RDW: 12.7 % (ref 11.5–15.5)
WBC: 7 10*3/uL (ref 4.0–10.5)

## 2017-06-19 MED ORDER — IBUPROFEN 600 MG PO TABS: 600.0000 mg | ORAL_TABLET | Freq: Four times a day (QID) | ORAL | 0 refills | Status: DC | PRN

## 2017-06-19 MED ORDER — IOPAMIDOL (ISOVUE-300) INJECTION 61%
INTRAVENOUS | Status: AC
  Filled 2017-06-19: qty 100

## 2017-06-19 MED ORDER — IOPAMIDOL (ISOVUE-300) INJECTION 61%
100.0000 mL | Freq: Once | INTRAVENOUS | Status: AC | PRN
  Administered 2017-06-19: 100 mL via INTRAVENOUS

## 2017-06-19 NOTE — ED Provider Notes (Signed)
[facility] Provider Note   CSN: [id number] Arrival date & time: 06/19/17  0050   By signing my name below, I, Soijett Blue, attest that this documentation has been prepared under the direction and in the presence of Derwood KaplanNanavati, Charis Juliana, MD. Electronically Signed: Soijett Blue, ED Scribe. 06/19/17. 5:07 AM.  History   Chief Complaint Chief Complaint  Patient presents with  . Groin Pain    HPI Jimmy Gutierrez is a 31 y.o. male who presents to the Emergency Department complaining of gradually worsening, 8-9/10, right groin pain x burning sensation onset 1 PM yesterday. Pt has not tried any medications for the relief of his symptoms. He notes that his right groin pain is worsened with movement, sneezing, or coughing. Pt reports that he completed heavy lifting prior to the onset of his symptoms. Denies hx of similar symptoms. He denies scrotal pain, dysuria, hematuria, nausea, vomiting, fever, chills, penile discharge, and any other symptoms. Denies trauma.    The history is provided by the patient. No language interpreter was used.    Past Medical History:  Diagnosis Date  . GERD (gastroesophageal reflux disease)   . Seasonal allergies     Patient Active Problem List   Diagnosis Date Noted  . Chest pain 05/05/2013  . Numbness 05/05/2013    History reviewed. No pertinent surgical history.     Home Medications    Prior to Admission medications   Medication Sig Start Date End Date Taking? Authorizing Provider  calcium carbonate (TUMS - DOSED IN MG ELEMENTAL CALCIUM) 500 MG chewable tablet Chew 1 tablet by mouth 3 (three) times daily as needed for indigestion or heartburn.    Yes [provider]  Multiple Vitamin (MULTIVITAMIN WITH MINERALS) TABS tablet Take 1 tablet by mouth daily.   Yes [provider]  omeprazole (PRILOSEC) 20 MG capsule Take 20 mg by mouth daily.   Yes [provider]  dicyclomine (BENTYL) 20 MG tablet Take 1 tablet (20  mg total) by mouth every 6 (six) hours as needed (for abdominal cramping). Patient not taking: Reported on 06/19/2017 03/08/17   Molpus, Jonny RuizJohn, MD  ibuprofen (ADVIL,MOTRIN) 600 MG tablet Take 1 tablet (600 mg total) by mouth every 6 (six) hours as needed. 06/19/17   Derwood KaplanNanavati, Mekenna Finau, MD  ondansetron (ZOFRAN ODT) 8 MG disintegrating tablet Take 1 tablet (8 mg total) by mouth every 8 (eight) hours as needed for nausea or vomiting. Patient not taking: Reported on 06/19/2017 03/08/17   Molpus, Jonny RuizJohn, MD    Family History Family History  Problem Relation Age of Onset  . Diabetes type II Mother   . High blood pressure Mother   . Cancer Father     Social History Social History  Substance Use Topics  . Smoking status: Never Smoker  . Smokeless tobacco: Never Used  . Alcohol use No     Comment: Rare special events.     Allergies   Mobic [meloxicam]   Review of Systems Review of Systems  Constitutional: Negative for chills and fever.  Gastrointestinal: Negative for nausea and vomiting.  Genitourinary: Negative for dysuria, hematuria and testicular pain.       +right groin pain  All other systems reviewed and are negative.    Physical Exam Updated Vital Signs BP 130/82 (BP Location: Left Arm)   Pulse 69   Temp 98.7 F (37.1 C) (Oral)   Resp 16   SpO2 100%   Physical Exam  Constitutional: He is oriented to person, place, and  time. He appears well-developed and well-nourished. No distress.  HENT:  Head: Normocephalic and atraumatic.  Eyes: EOM are normal.  Neck: Neck supple.  Cardiovascular: Normal rate, regular rhythm and normal heart sounds.  Exam reveals no gallop and no friction rub.   No murmur heard. Pulmonary/Chest: Effort normal and breath sounds normal. No respiratory distress. He has no wheezes. He has no rales.  Abdominal: Soft. He exhibits no distension. There is no tenderness. Hernia confirmed negative in the right inguinal area.  Genitourinary: Cremasteric reflex is  present.  Genitourinary Comments: Scribe chaperone present for exam. Testicles are mobile. Positive cremasteric reflex. No inguinal hernia palpated.  Musculoskeletal: Normal range of motion.  Neurological: He is alert and oriented to person, place, and time.  Skin: Skin is warm and dry.  Psychiatric: He has a normal mood and affect. His behavior is normal.  Nursing note and vitals reviewed.    ED Treatments / Results  DIAGNOSTIC STUDIES: Oxygen Saturation is 100% on RA, nl by my interpretation.    COORDINATION OF CARE: 2:49 AM Discussed treatment plan with pt at bedside which includes CT and pt agreed to plan.   Labs (all labs ordered are listed, but only abnormal results are displayed) Labs Reviewed  CBC - Abnormal; Notable for the following:       Result Value   MCHC 36.2 (*)    All other components within normal limits  I-STAT CHEM 8, ED    EKG  EKG Interpretation None       Radiology Ct Pelvis W Contrast  Result Date: 06/19/2017 CLINICAL DATA:  Initial evaluation for acute right groin pain, concern for hernia. EXAM: CT PELVIS WITH CONTRAST TECHNIQUE: Multidetector CT imaging of the pelvis was performed using the standard protocol following the bolus administration of intravenous contrast. CONTRAST:  ISOVUE-300 IOPAMIDOL (ISOVUE-300) INJECTION 61% COMPARISON:  None. FINDINGS: Urinary Tract: Visualized ureters of normal caliber without hydroureter or inflammation. Bladder within normal limits without acute abnormality. Bowel: Visualized bowel of normal caliber without acute inflammatory changes. Appendix within normal limits. Vascular/Lymphatic: Normal intravascular enhancement seen throughout the visualized vascular structures. No adenopathy. Reproductive:  Prostate within normal limits. Other: No free air or fluid. No hernia identified. No abnormality seen within the right inguinal region to explain symptoms. Musculoskeletal: Visualized osseous structures within normal  limits. No worrisome lytic or blastic osseous lesions. IMPRESSION: No acute abnormality identified within the pelvis. No hernia identified. Electronically Signed   By: Rise Mu M.D.   On: 06/19/2017 04:20    Procedures Procedures (including critical care time)  Medications Ordered in ED Medications  iopamidol (ISOVUE-300) 61 % injection 100 mL (100 mLs Intravenous Contrast Given 06/19/17 0354)     Initial Impression / Assessment and Plan / ED Course  I have reviewed the triage vital signs and the nursing notes.  Pertinent labs & imaging results that were available during my care of the patient were reviewed by me and considered in my medical decision making (see chart for details).      Pt comes in with cc of inguinal / suprapubic pain. Pain is worse with cough or with him try to sit up. Clinically, sounds like a hernia, but on exam there is no clear evidence of one. Scrotal and testicle exam is normal. There is no concerns for torsion. No riask for STDs and there is no urinary symptoms. Pain not typical of renal stones and neg mcburneys. CY ordered of the pelvis - and is neg  for  stones / hernia or appendicitis. Unsure what the pain is - so pcp f/u advised.  Final Clinical Impressions(s) / ED Diagnoses   Final diagnoses:  Right inguinal pain    New Prescriptions New Prescriptions   IBUPROFEN (ADVIL,MOTRIN) 600 MG TABLET    Take 1 tablet (600 mg total) by mouth every 6 (six) hours as needed.   I personally performed the services described in this documentation, which was scribed in my presence. The recorded information has been reviewed and is accurate.     Derwood Kaplan, MD 06/19/17 [id number]

## 2017-06-19 NOTE — ED Triage Notes (Signed)
Pt is c/o of right groin pain that worsens with movement. Denies dysuria, trauma or being sexually active.

## 2017-06-19 NOTE — Discharge Instructions (Signed)
°  All the results in the ER are normal, labs and imaging. We are not sure what is causing your symptoms. No signs of stones, hernia, abscess seen. Appendix is also normal. The workup in the ER is not complete, and is limited to screening for life threatening and emergent conditions only, so please see a primary care doctor for further evaluation.

## 2017-06-19 NOTE — ED Notes (Signed)
PT DISCHARGED. INSTRUCTIONS AND PRESCRIPTION GIVEN. AAOX4. PT IN NO APPARENT DISTRESS OR PAIN. THE OPPORTUNITY TO ASK QUESTIONS WAS PROVIDED. 

## 2017-07-26 ENCOUNTER — Other Ambulatory Visit (HOSPITAL_COMMUNITY): Payer: Self-pay | Admitting: Internal Medicine

## 2017-07-26 DIAGNOSIS — K219 Gastro-esophageal reflux disease without esophagitis: Secondary | ICD-10-CM

## 2017-08-05 ENCOUNTER — Ambulatory Visit (HOSPITAL_COMMUNITY)
Admission: RE | Admit: 2017-08-05 | Discharge: 2017-08-05 | Disposition: A | Discharge status: Home / Self Care | Payer: 59 | Source: Ambulatory Visit | Attending: Internal Medicine | Admitting: Internal Medicine

## 2017-08-05 DIAGNOSIS — K219 Gastro-esophageal reflux disease without esophagitis: Secondary | ICD-10-CM | POA: Diagnosis not present

## 2017-08-05 DIAGNOSIS — K449 Diaphragmatic hernia without obstruction or gangrene: Secondary | ICD-10-CM | POA: Diagnosis not present

## 2017-09-24 ENCOUNTER — Ambulatory Visit (INDEPENDENT_AMBULATORY_CARE_PROVIDER_SITE_OTHER): Payer: 59 | Admitting: Gastroenterology

## 2017-09-24 ENCOUNTER — Other Ambulatory Visit: Payer: Self-pay

## 2017-09-24 ENCOUNTER — Encounter: Payer: Self-pay | Admitting: Gastroenterology

## 2017-09-24 ENCOUNTER — Encounter (INDEPENDENT_AMBULATORY_CARE_PROVIDER_SITE_OTHER): Payer: Self-pay

## 2017-09-24 VITALS — BP 120/80 | HR 55 | Temp 97.7°F | Ht 66.0 in | Wt 236.8 lb

## 2017-09-24 DIAGNOSIS — R740 Nonspecific elevation of levels of transaminase and lactic acid dehydrogenase [LDH]: Secondary | ICD-10-CM

## 2017-09-24 DIAGNOSIS — R7401 Elevation of levels of liver transaminase levels: Secondary | ICD-10-CM

## 2017-09-24 DIAGNOSIS — K219 Gastro-esophageal reflux disease without esophagitis: Secondary | ICD-10-CM

## 2017-09-24 NOTE — Progress Notes (Signed)
Jimmy Repress, MD [address]  [address]  [place], [place] [address]  Main: [phone]  Fax: [phone]    Gastroenterology Consultation  Referring Provider:     Carlean Jews, NP Primary Care Physician: Dr Jimmy Gutierrez Primary Gastroenterologist:  Dr. Arlyss Gutierrez Reason for Consultation:     Hiatal hernia, GERD        HPI:   Jimmy Gutierrez is a 31 y.o. y/o male referred by Dr. Patient, No Pcp Per  for consultation & management of Hiatal hernia, GERD. He has been experiencing Heart burn and regurgitation since march or April 2018. Symptoms of heart burn subsided since starting omeprazole  in the morning since 06/2017. He continues to have regurgitation especially when he bends over. He had a x-ray upper GI in 07/2017 which revealed large sliding hiatal hernia and severe acid reflux Therefore he was referred here for possible surgical evaluation. He is healthy otherwise and denies any other GI symptoms. He is watching diet and avoiding food triggers that aggravate acid reflux symptoms. He has his bed in the flank position and following antireflux measures. He does not smoke or drink alcohol. His father was diagnosed with esophageal cancer when he was in early 73s. He is deceased. Patient did not have any abdominal surgeries. He denies any constitutional symptoms.  IMPRESSION: 1. Severe gastroesophageal reflux high into the cervical esophagus. 2. Large sliding-type hiatal hernia. Recommend GI and potential surgical consultation. 3. No mucosal irregularity identified in the esophagus, stomach, or duodenum. GI Procedures: None  Past Medical History:  Diagnosis Date  . GERD (gastroesophageal reflux disease)   . Seasonal allergies     No past surgical history on file.  Prior to Admission medications   Medication Sig Start Date End Date Taking? Authorizing Provider  Ginger, Zingiber officinalis, (GINGER ROOT) 550 MG CAPS Take 1 capsule by mouth daily.    Yes [provider]  Boris Lown Oil 500 MG CAPS Take 1 capsule by mouth daily.   Yes [provider]  Multiple Vitamin (MULTIVITAMIN WITH MINERALS) TABS tablet Take 1 tablet by mouth daily.   Yes [provider]  omeprazole (PRILOSEC) 20 MG capsule Take 20 mg by mouth daily.   Yes [provider]    Family History  Problem Relation Age of Onset  . Diabetes type II Mother   . High blood pressure Mother   . Cancer Father      Social History  Substance Use Topics  . Smoking status: Never Smoker  . Smokeless tobacco: Never Used  . Alcohol use No     Comment: Rare special events.    Allergies as of 09/24/2017 - Review Complete 09/24/2017  Allergen Reaction Noted  . Mobic [meloxicam]  05/23/2013    Review of Systems:    All systems reviewed and negative except where noted in HPI.   Physical Exam:  BP 120/80   Pulse (!) 55   Temp 97.7 F (36.5 C) (Oral)   Ht  (1.676 m)   Wt 236 lb 12.8 oz (107.4 kg)   BMI 38.22 kg/m  No LMP for male patient.  General:   Alert,  Well-developed, well-nourished, pleasant and cooperative in NAD Head:  Normocephalic and atraumatic. Eyes:  Sclera clear, no icterus.   Conjunctiva pink. Ears:  Normal auditory acuity. Nose:  No deformity, discharge, or lesions. Mouth:  No deformity or lesions,oropharynx pink & moist. Neck:  Supple; no masses or thyromegaly. Lungs:  Respirations even  and unlabored.  Clear throughout to auscultation.   No wheezes, crackles, or rhonchi. No acute distress. Heart:  Regular rate and rhythm; no murmurs, clicks, rubs, or gallops. Abdomen:  Normal bowel sounds. Soft, non-tender and non-distended without masses, hepatosplenomegaly or hernias noted.  No guarding or rebound tenderness.   Rectal: Nor performed Msk:  Symmetrical without gross deformities. Good, equal movement & strength bilaterally. Pulses:  Normal pulses noted. Extremities:  No clubbing or edema.  No cyanosis. Neurologic:   Alert and oriented x3;  grossly normal neurologically. Skin:  Intact without significant lesions or rashes. No jaundice. Psych:  Alert and cooperative. Normal mood and affect.  Imaging Studies: Reviewed  Assessment and Plan:   Jimmy Gutierrez is a 31 y.o. male with No past medical history, chronic GERD with typical symptoms, imaging reveals large sliding hiatal hernia. His GERD symptoms are mostly under control on daily PPI. - Recommend EGD for possible surgical evaluation and given family history of esophageal cancer - Continue daily PPI and increase to twice daily if needed - He may also need pH impedance testing depending on the EGD results - Continue antireflux measures   Follow up in 4 weeks  Jimmy Repress, MD

## 2017-10-21 ENCOUNTER — Telehealth: Payer: Self-pay | Admitting: Gastroenterology

## 2017-10-21 NOTE — Telephone Encounter (Signed)
Patient called to cancel his procedure. He will call us back next month to reschedule. I called [facility]

## 2017-10-28 ENCOUNTER — Ambulatory Visit: Admit: 2017-10-28 | Payer: 59 | Admitting: Gastroenterology

## 2017-10-28 SURGERY — ESOPHAGOGASTRODUODENOSCOPY (EGD) WITH PROPOFOL
Anesthesia: General

## 2018-02-15 ENCOUNTER — Ambulatory Visit: Payer: Self-pay | Admitting: Nurse Practitioner

## 2018-08-08 IMAGING — CT CT PELVIS W/ CM
2 of 3 series · 16 of 46 positions shown, 18 images · IV contrast (iopamidol)
Comparison: None.

CLINICAL DATA: Initial evaluation for acute right groin pain,
concern for hernia.

EXAM:
CT PELVIS WITH CONTRAST
TECHNIQUE: Multidetector CT imaging of the pelvis was performed using the
standard protocol following the bolus administration of intravenous
contrast.
CONTRAST:  100mL UN2YQ6-XSS IOPAMIDOL (UN2YQ6-XSS) INJECTION 61%

[Series 2: pelvis with · axial · 0.81mm/px · z∈[-326,-61]mm · 13 of 61 slices shown, 15 images]
[im 4/61  soft-tissue]
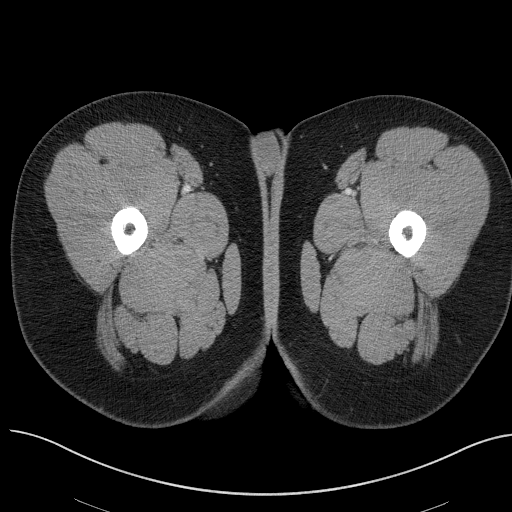
[im 4/61  bone]
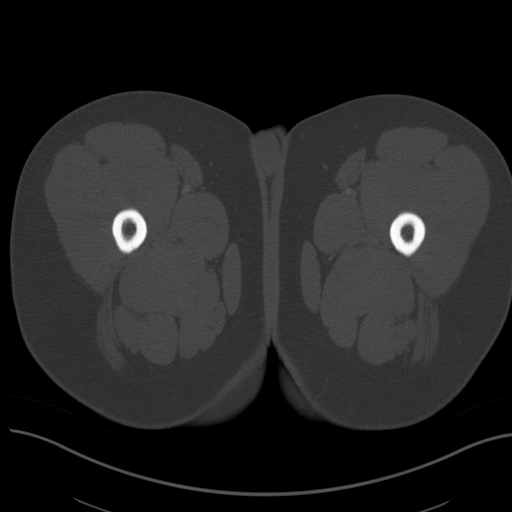
[im 8/61  soft-tissue]
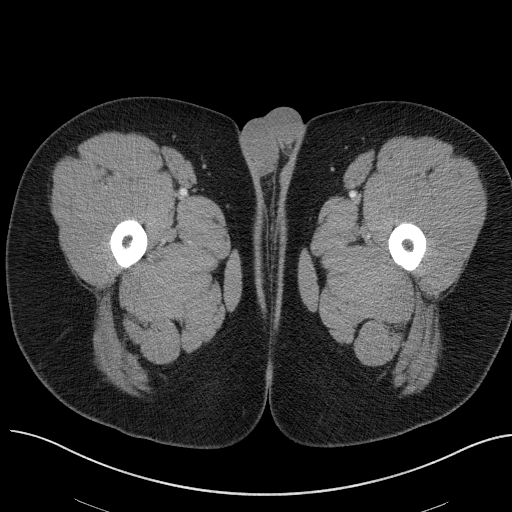
[im 12/61  soft-tissue]
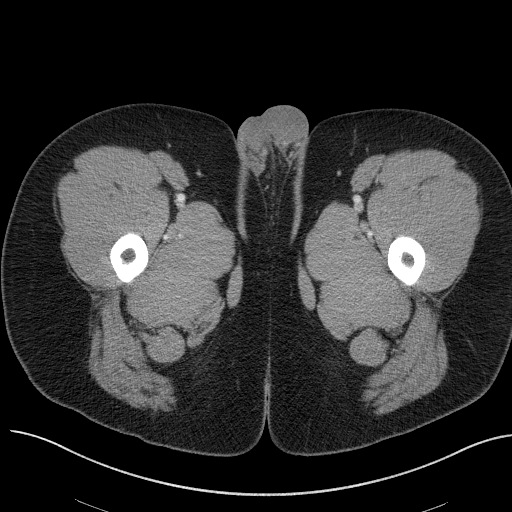
[im 18/61  soft-tissue]
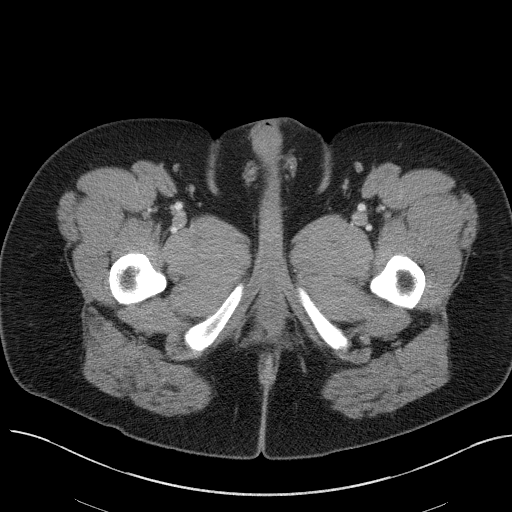
[im 22/61  soft-tissue]
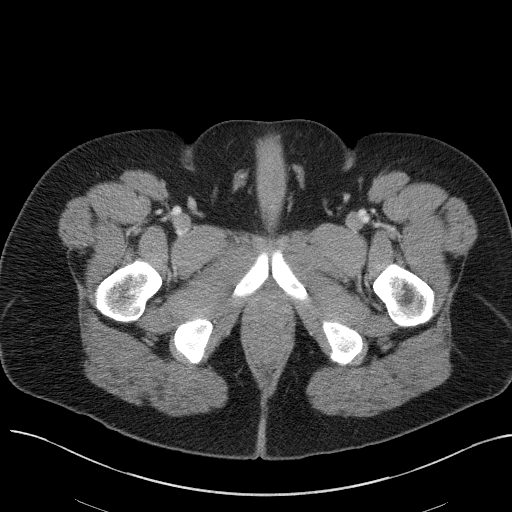
[im 26/61  soft-tissue]
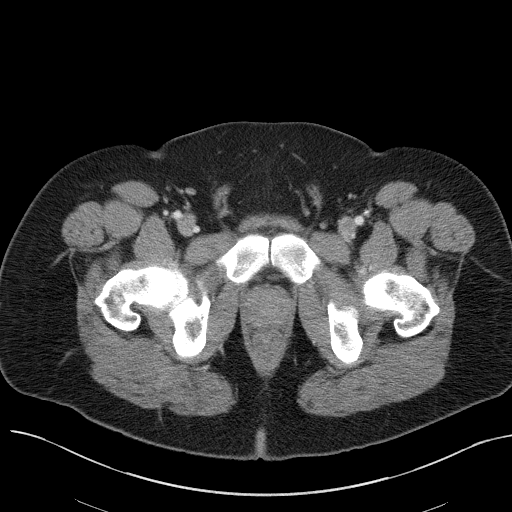
[im 31/61  soft-tissue]
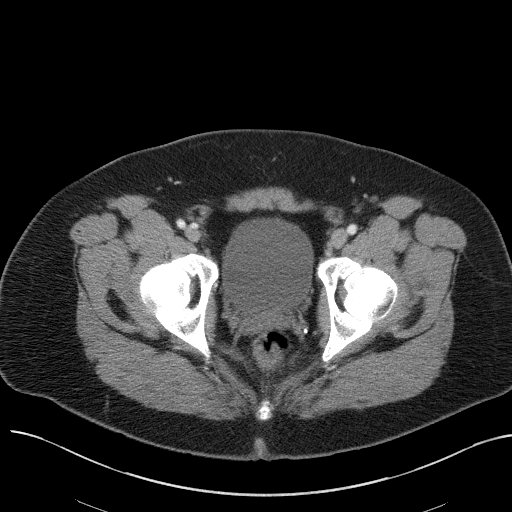
[im 35/61  soft-tissue]
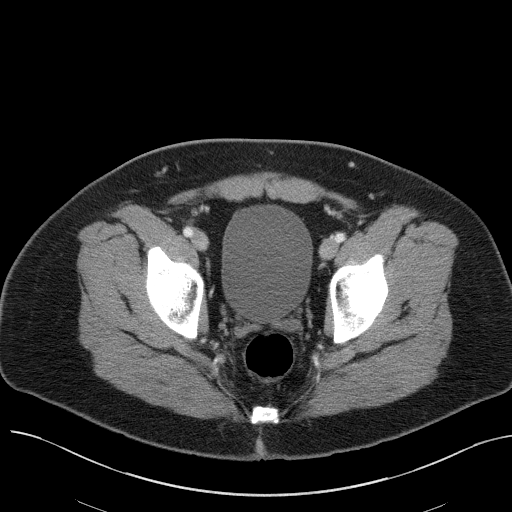
[im 39/61  soft-tissue]
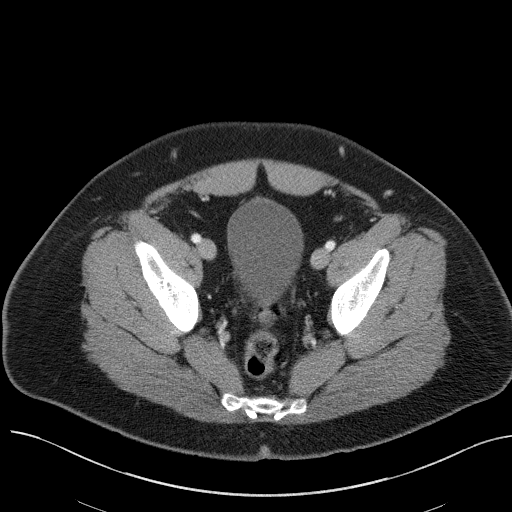
[im 39/61  bone]
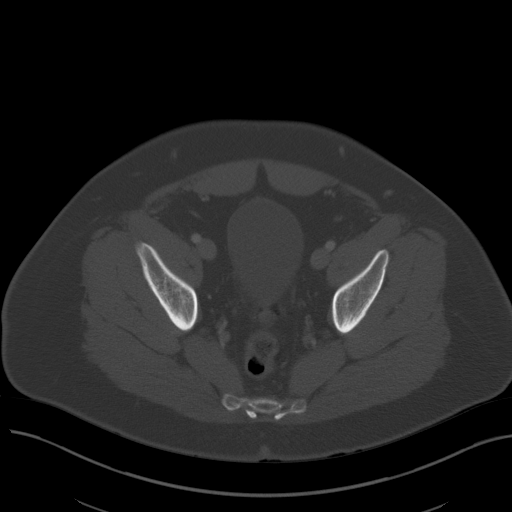
[im 43/61  soft-tissue]
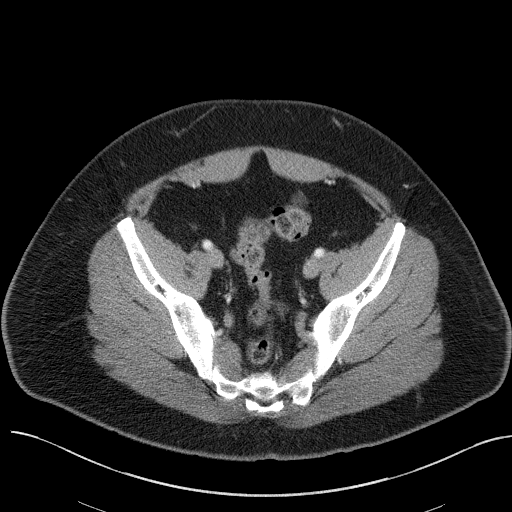
[im 49/61  soft-tissue]
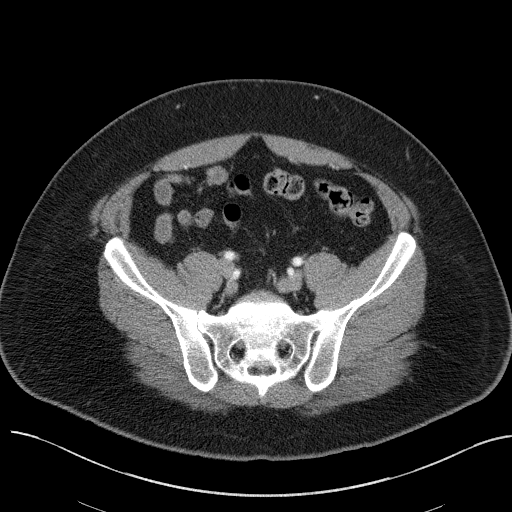
[im 53/61  soft-tissue]
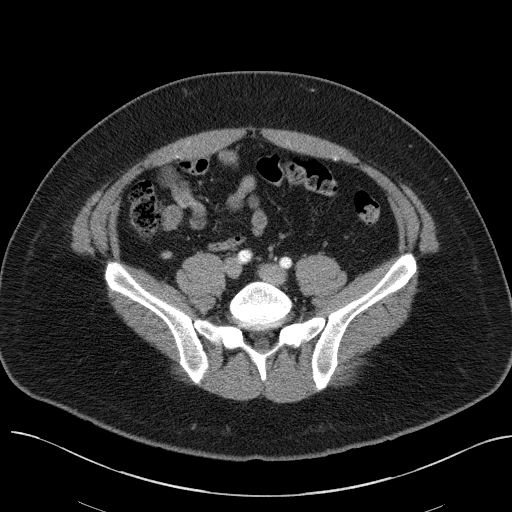
[im 57/61  soft-tissue]
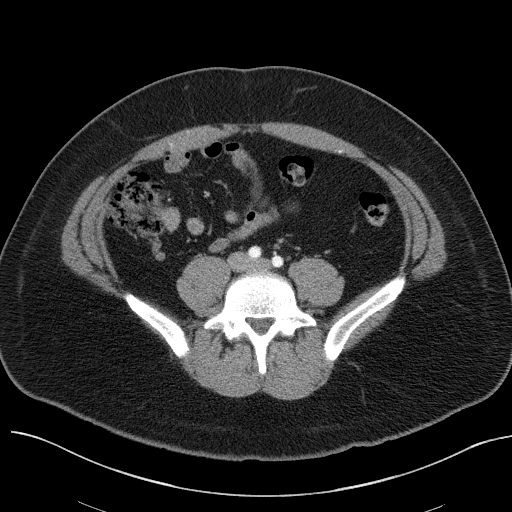

[Series 4: coronal images · coronal · 0.60mm/px · 3 of 146 slices shown]
[im 49/146  soft-tissue]
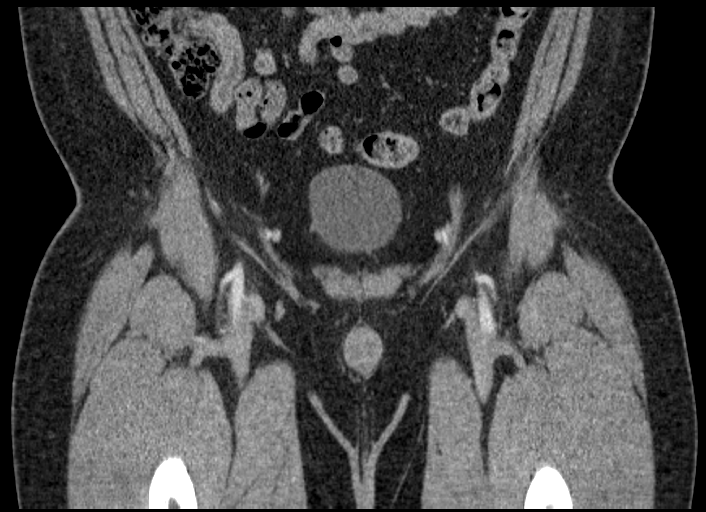
[im 65/146  soft-tissue]
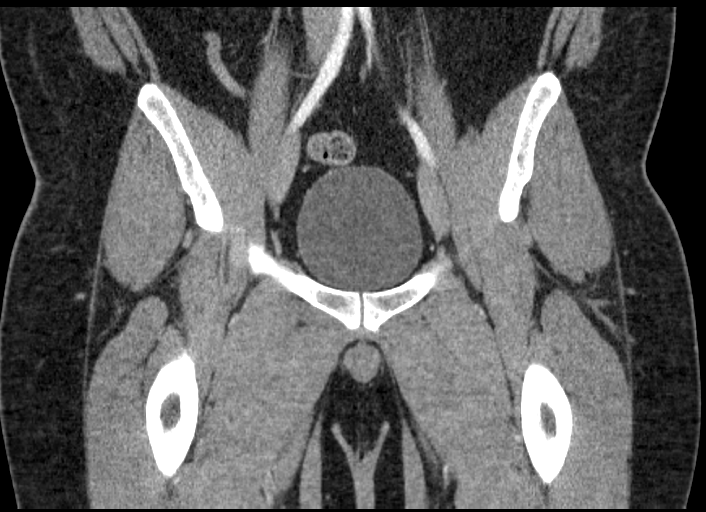
[im 81/146  soft-tissue]
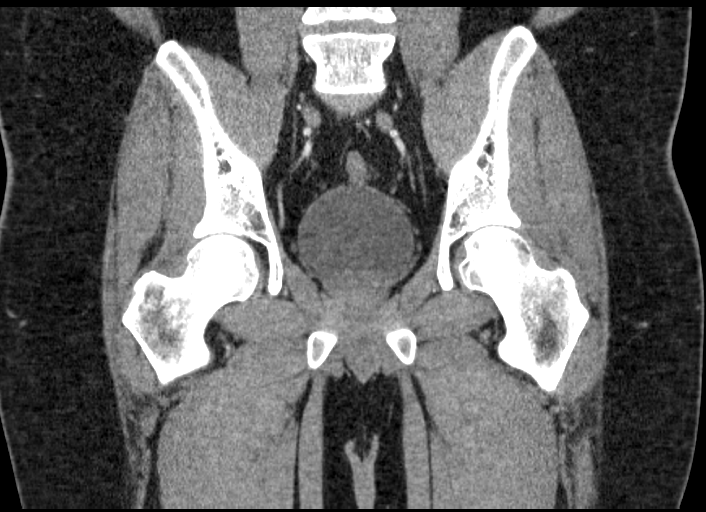

[16 of 46 positions shown; findings below may reference images not displayed]

FINDINGS: Urinary Tract: Visualized ureters of normal caliber without
hydroureter or inflammation. Bladder within normal limits without
acute abnormality.

Bowel: Visualized bowel of normal caliber without acute inflammatory
changes. Appendix within normal limits.

Vascular/Lymphatic: Normal intravascular enhancement seen throughout
the visualized vascular structures. No adenopathy.

Reproductive:  Prostate within normal limits.

Other: No free air or fluid. No hernia identified. No abnormality
seen within the right inguinal region to explain symptoms.

Musculoskeletal: Visualized osseous structures within normal limits.
No worrisome lytic or blastic osseous lesions.
IMPRESSION: No acute abnormality identified within the pelvis. No hernia
identified.

## 2020-06-16 ENCOUNTER — Encounter (HOSPITAL_COMMUNITY): Payer: Self-pay

## 2020-06-16 ENCOUNTER — Other Ambulatory Visit: Payer: Self-pay

## 2020-06-16 ENCOUNTER — Ambulatory Visit (INDEPENDENT_AMBULATORY_CARE_PROVIDER_SITE_OTHER): Payer: 59

## 2020-06-16 ENCOUNTER — Ambulatory Visit (HOSPITAL_COMMUNITY)
Admission: EM | Admit: 2020-06-16 | Discharge: 2020-06-16 | Disposition: A | Discharge status: Home / Self Care | Payer: 59

## 2020-06-16 DIAGNOSIS — S9032XA Contusion of left foot, initial encounter: Secondary | ICD-10-CM

## 2020-06-16 DIAGNOSIS — M79672 Pain in left foot: Secondary | ICD-10-CM | POA: Diagnosis not present

## 2020-06-16 DIAGNOSIS — S93602A Unspecified sprain of left foot, initial encounter: Secondary | ICD-10-CM

## 2020-06-16 DIAGNOSIS — M7989 Other specified soft tissue disorders: Secondary | ICD-10-CM | POA: Diagnosis not present

## 2020-06-16 DIAGNOSIS — R2242 Localized swelling, mass and lump, left lower limb: Secondary | ICD-10-CM

## 2020-06-16 MED ORDER — NAPROXEN 500 MG PO TABS: 500.0000 mg | ORAL_TABLET | Freq: Two times a day (BID) | ORAL | 0 refills | Status: AC

## 2020-06-16 NOTE — ED Triage Notes (Addendum)
Pt presents with left ankle pain and swelling x 1 week, after twisted the ankle when running with his dog. States he can walk with the post op shoes. Tylenol, rest, ice and elevation relieve the pain.

## 2020-06-16 NOTE — ED Provider Notes (Signed)
[facility]   MRN: [id number] DOB: 07/27/1986  Subjective:   Jimmy Gutierrez is a 34 y.o. male presenting for 1 week history of persistent swelling and pain of his left foot.  Patient was running with his dog, ended up jumping over a wooden post and landed awkwardly injuring his left ankle and foot.  He has since had persistent swelling, bruising and pain mostly over his foot.  Symptoms relieved with rest, wearing postop shoe.  He has used Tylenol, icing with some relief.  No current facility-administered medications for this encounter.  Current Outpatient Medications:  .  acetaminophen (TYLENOL) 500 MG tablet, Take 500 mg by mouth every 6 (six) hours as needed., Disp: , Rfl:  .  Cholecalciferol (VITAMIN D3) 50 MCG (2000 UT) TABS, Take by mouth., Disp: , Rfl:  .  Ginger, Zingiber officinalis, (GINGER ROOT) 550 MG CAPS, Take 1 capsule by mouth daily., Disp: , Rfl:  .  Krill Oil 500 MG CAPS, Take 1 capsule by mouth daily., Disp: , Rfl:  .  Multiple Vitamin (MULTIVITAMIN WITH MINERALS) TABS tablet, Take 1 tablet by mouth daily., Disp: , Rfl:  .  omeprazole (PRILOSEC) 20 MG capsule, Take 20 mg by mouth daily., Disp: , Rfl:    Allergies  Allergen Reactions  . Mobic [Meloxicam]     Arm pain     Past Medical History:  Diagnosis Date  . GERD (gastroesophageal reflux disease)   . Seasonal allergies      History reviewed. No pertinent surgical history.  Family History  Problem Relation Age of Onset  . Diabetes type II Mother   . High blood pressure Mother   . Cancer Father     Social History   Tobacco Use  . Smoking status: Never Smoker  . Smokeless tobacco: Never Used  Substance Use Topics  . Alcohol use: No    Comment: Rare special events.  . Drug use: No    ROS   Objective:   Vitals: BP 135/84 (BP Location: Right Arm)   Pulse 80   Temp 98.1 F (36.7 C) (Oral)   Resp 16   SpO2 99%   Physical Exam Constitutional:      General: He is not in acute  distress.    Appearance: Normal appearance. He is well-developed and normal weight. He is not ill-appearing, toxic-appearing or diaphoretic.  HENT:     Head: Normocephalic and atraumatic.     Right Ear: External ear normal.     Left Ear: External ear normal.     Nose: Nose normal.     Mouth/Throat:     Pharynx: Oropharynx is clear.  Eyes:     General: No scleral icterus.       Right eye: No discharge.        Left eye: No discharge.     Extraocular Movements: Extraocular movements intact.     Pupils: Pupils are equal, round, and reactive to light.  Cardiovascular:     Rate and Rhythm: Normal rate.  Pulmonary:     Effort: Pulmonary effort is normal.  Musculoskeletal:     Cervical back: Normal range of motion.     Left ankle: No swelling, deformity, ecchymosis or lacerations. No tenderness. Normal range of motion.     Left Achilles Tendon: No tenderness or defects. Thompson's test negative.     Left foot: Decreased range of motion. Normal capillary refill. Swelling (dorsal aspect over midline to lateral aspect approximately, there is associated ecchymosis dorsolaterally extending  all the way to the fourth and fifth toes), tenderness (Laterally over fifth proximal metatarsal, base of the fifth metatarsal) and bony tenderness present. No deformity or crepitus.  Neurological:     Mental Status: He is alert and oriented to person, place, and time.  Psychiatric:        Mood and Affect: Mood normal.        Behavior: Behavior normal.        Thought Content: Thought content normal.        Judgment: Judgment normal.    DG Foot Complete Left  Result Date: 06/16/2020 CLINICAL DATA:  LEFT foot pain and swelling. Injury last weekend. Bruising in the LATERAL LEFT foot. EXAM: LEFT FOOT - COMPLETE 3+ VIEW COMPARISON:  None. FINDINGS: There is soft tissue swelling adjacent to the 5th metatarsal. No acute fracture or subluxation. No radiopaque foreign body or soft tissue gas. Small plantar calcaneal  spur. IMPRESSION: Soft tissue swelling. Electronically Signed   By: Norva Pavlov M.D.   On: 06/16/2020 12:05   Assessment and Plan :   PDMP not reviewed this encounter.  1. Left foot pain   2. Localized swelling of left foot   3. Traumatic ecchymosis of left foot, initial encounter   4. Sprain of left foot, initial encounter     Patient given a postop shoe, recommended supportive care.  Use naproxen.  Patient has used ibuprofen without any issue in the past and therefore allergy to meloxicam is more likely an intolerance. Counseled patient on potential for adverse effects with medications prescribed/recommended today, ER and return-to-clinic precautions discussed, patient verbalized understanding.    Wallis Bamberg, PA-C 06/16/20 1335

## 2021-08-05 IMAGING — DX DG FOOT COMPLETE 3+V*L*
3 series · 3 of 3 positions shown · non-contrast
Comparison: None.

CLINICAL DATA: LEFT foot pain and swelling. Injury last weekend.
Bruising in the LATERAL LEFT foot.

EXAM:
LEFT FOOT - COMPLETE 3+ VIEW

[foot ap]
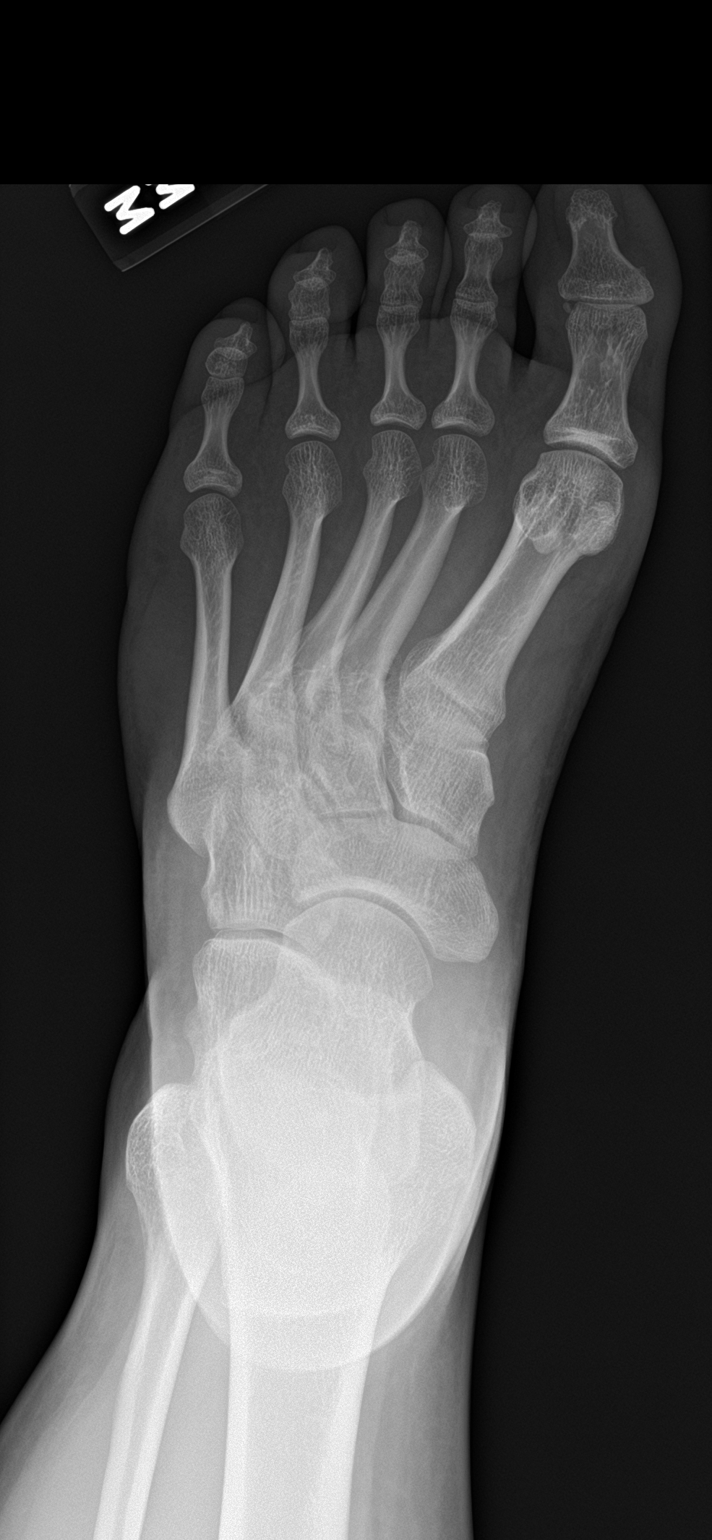

[foot obl]
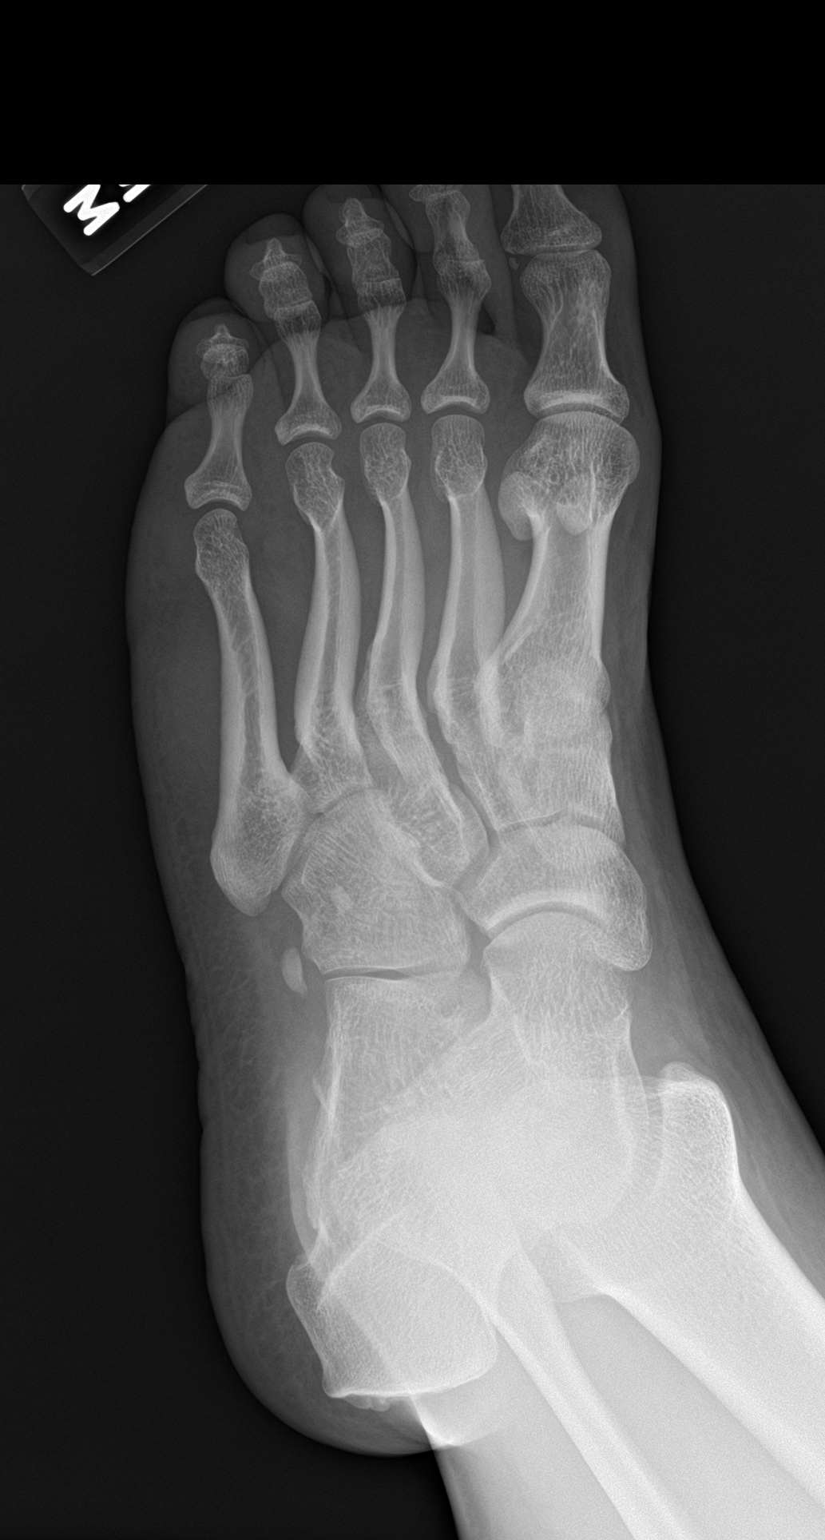

[foot lat]
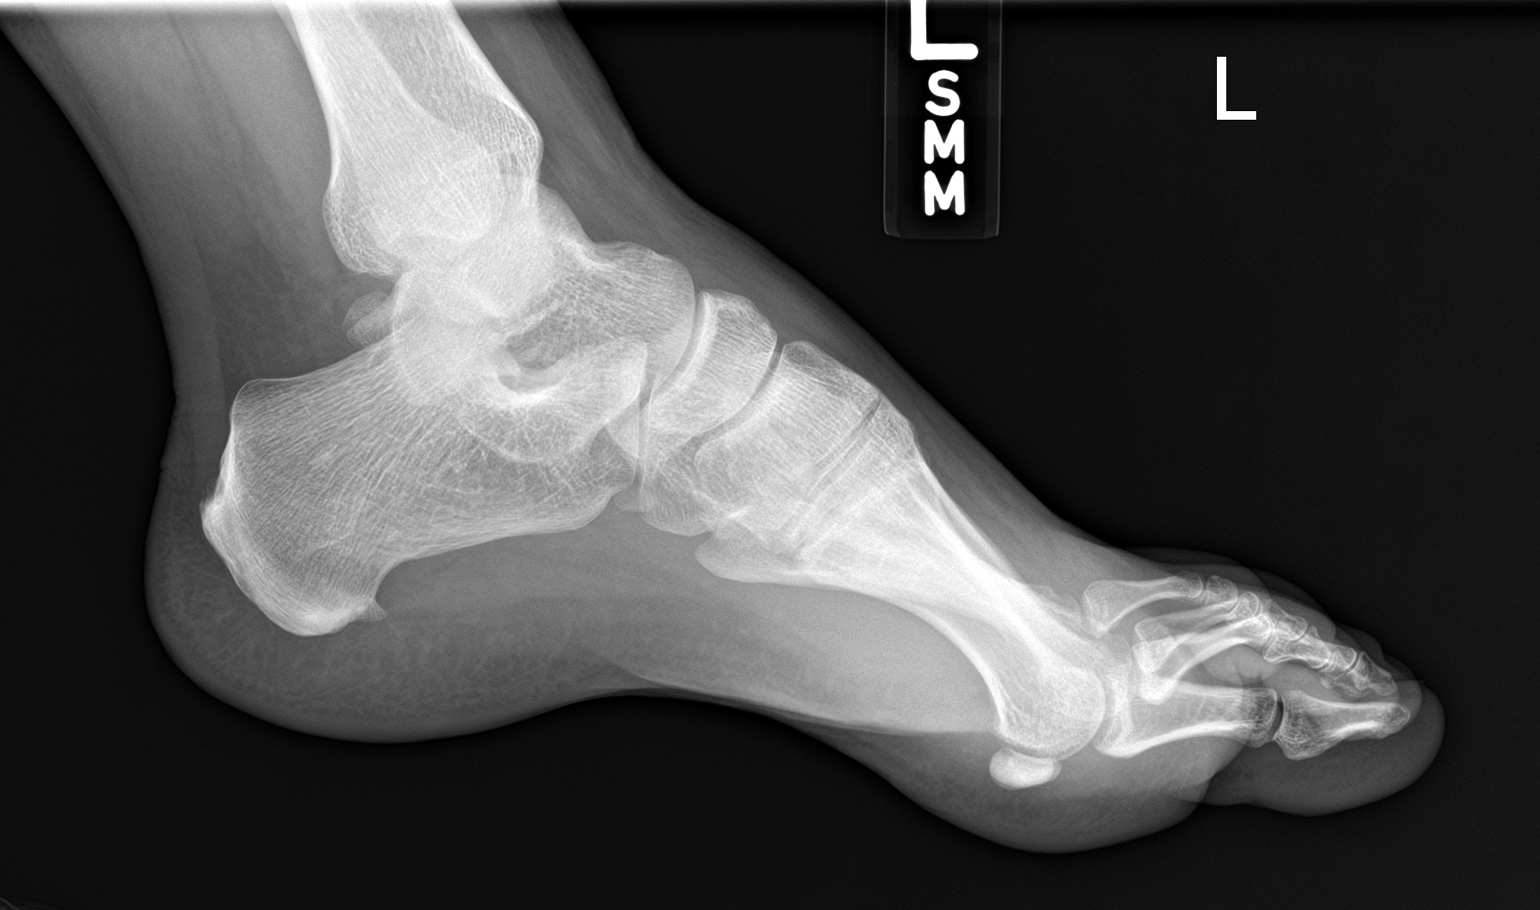

[3 of 3 positions shown; findings below may reference images not displayed]

FINDINGS: There is soft tissue swelling adjacent to the 5th metatarsal. No
acute fracture or subluxation. No radiopaque foreign body or soft
tissue gas. Small plantar calcaneal spur.
IMPRESSION: Soft tissue swelling.
